# Patient Record
Sex: Female | Born: 1990 | Race: Black or African American | Hispanic: No | Marital: Single | Smoking: Former smoker
Health system: Southern US, Community
[De-identification: ages and names within clinical notes are randomized; demographics above are authoritative.]

---

## 2017-01-31 ENCOUNTER — Emergency Department (HOSPITAL_COMMUNITY)
Admission: EM | Admit: 2017-01-31 | Discharge: 2017-01-31 | Disposition: A | Discharge status: Home / Self Care | Payer: Self-pay | Attending: Emergency Medicine | Admitting: Emergency Medicine

## 2017-01-31 ENCOUNTER — Encounter (HOSPITAL_COMMUNITY): Payer: Self-pay

## 2017-01-31 DIAGNOSIS — Z87891 Personal history of nicotine dependence: Secondary | ICD-10-CM | POA: Insufficient documentation

## 2017-01-31 DIAGNOSIS — M5442 Lumbago with sciatica, left side: Secondary | ICD-10-CM | POA: Insufficient documentation

## 2017-01-31 DIAGNOSIS — R030 Elevated blood-pressure reading, without diagnosis of hypertension: Secondary | ICD-10-CM | POA: Insufficient documentation

## 2017-01-31 DIAGNOSIS — M5432 Sciatica, left side: Secondary | ICD-10-CM

## 2017-01-31 DIAGNOSIS — I10 Essential (primary) hypertension: Secondary | ICD-10-CM

## 2017-01-31 MED ORDER — KETOROLAC TROMETHAMINE 30 MG/ML IJ SOLN
30.0000 mg | Freq: Once | INTRAMUSCULAR | Status: AC
  Administered 2017-01-31: 30 mg via INTRAMUSCULAR
  Filled 2017-01-31: qty 1

## 2017-01-31 MED ORDER — CYCLOBENZAPRINE HCL 10 MG PO TABS: 10.0000 mg | ORAL_TABLET | Freq: Two times a day (BID) | ORAL | 0 refills | Status: AC | PRN

## 2017-01-31 MED ORDER — NAPROXEN 375 MG PO TABS: 375.0000 mg | ORAL_TABLET | Freq: Two times a day (BID) | ORAL | 0 refills | Status: AC

## 2017-01-31 MED ORDER — PREDNISONE 10 MG (21) PO TBPK: ORAL_TABLET | ORAL | 0 refills | Status: AC

## 2017-01-31 NOTE — Discharge Instructions (Signed)
Your blood pressure is elevated today.  This is sometimes related to pain but it would be a good idea to follow up with a primary care doctor for a physical and to have that rechecked.  Take the medications as prescribed.

## 2017-01-31 NOTE — ED Provider Notes (Addendum)
[facility]-EMERGENCY DEPT Provider Note   CSN: [id number] Arrival date & time: 01/31/17  0751     History   Chief Complaint Chief Complaint  Patient presents with  . Back Pain    HPI Melissa Wade is a 26 y.o. female.  HPI Patient presents to the emergency room for evaluation ofpain radiating from her back down her leg. Patient has had episodes like this in the past. Previously when she was living in [place] she was evaluated in the hospital because of an exacerbation. She was told that her symptoms were probably related to an episode of sciatica. Patient states she was given  Course of steroids and her symptoms resolved after a few days.  Intermittently she will have some flares but generally she does not have any trouble with back pain. She started having symptoms a few days ago again. She tried stretching but it has not helped. She has tried over-the-counter medications without relief. She denies any incontinence. No focal numbness or weakness. Pain is sharp and radiates from her left lower back region down to her leg. No recent falls or injuries History reviewed. No pertinent past medical history. Pt denies she could be pregnant There are no active problems to display for this patient.   History reviewed. No pertinent surgical history.  OB History    No data available       Home Medications    Prior to Admission medications   Medication Sig Start Date End Date Taking? Authorizing Provider  cyclobenzaprine (FLEXERIL) 10 MG tablet Take 1 tablet (10 mg total) by mouth 2 (two) times daily as needed for muscle spasms. 01/31/17   Linwood Dibbles, MD  naproxen (NAPROSYN) 375 MG tablet Take 1 tablet (375 mg total) by mouth 2 (two) times daily. 01/31/17   Linwood Dibbles, MD  predniSONE (STERAPRED UNI-PAK 21 TAB) 10 MG (21) TBPK tablet Take 6 tabs by mouth daily  for 2 days, then 5 tabs for 2 days, then 4 tabs for 2 days, then 3 tabs for 2 days, 2 tabs for 2 days, then 1 tab by mouth daily for 2 days  01/31/17   Linwood Dibbles, MD    Family History History reviewed. No pertinent family history.  Social History Social History  Substance Use Topics  . Smoking status: Former Smoker    Types: Cigarettes  . Smokeless tobacco: Never Used  . Alcohol use Yes     Comment: occasionally     Allergies   Patient has no known allergies.   Review of Systems Review of Systems  All other systems reviewed and are negative.    Physical Exam Updated Vital Signs BP (!) 161/105 (BP Location: Left Arm)   Pulse 87   Temp 98.6 F (37 C) (Oral)   Resp 18   Ht 1.778 m ( )   Wt 122.5 kg (270 lb)   LMP 12/31/2016 (Approximate)   SpO2 98%   BMI 38.74 kg/m   Physical Exam  Constitutional: She appears well-developed and well-nourished. No distress.  overweight  HENT:  Head: Normocephalic and atraumatic.  Right Ear: External ear normal.  Left Ear: External ear normal.  Eyes: Conjunctivae are normal. Right eye exhibits no discharge. Left eye exhibits no discharge. No scleral icterus.  Neck: Neck supple. No tracheal deviation present.  Cardiovascular: Normal rate.   Pulmonary/Chest: Effort normal. No stridor. No respiratory distress.  Abdominal: She exhibits no distension.  Musculoskeletal: She exhibits no edema.       Lumbar back: She  exhibits tenderness. She exhibits no bony tenderness, no swelling and no deformity.  Neurological: She is alert. No sensory deficit. Cranial nerve deficit: no gross deficits. She exhibits normal muscle tone. Coordination normal.  5/5 strength bilateral lower extrem   Skin: Skin is warm and dry. No rash noted.  Psychiatric: She has a normal mood and affect.  Nursing note and vitals reviewed.    ED Treatments / Results    Radiology No results found.  Procedures Procedures (including critical care time)  Medications Ordered in ED Medications  ketorolac (TORADOL) 30 MG/ML injection 30 mg (not administered)     Initial Impression / Assessment  and Plan / ED Course  I have reviewed the triage vital signs and the nursing notes.  Pertinent labs & imaging results that were available during my care of the patient were reviewed by me and considered in my medical decision making (see chart for details).    No sign of acute neurological or vascular emergency associated with pt's back pain.  May have a component of sciatica.  Safe for outpatient follow up.   HTN noted.  Could be related to her pain.  Recommended routine recheck  Final Clinical Impressions(s) / ED Diagnoses   Final diagnoses:  Sciatica of left side  Hypertension, unspecified type    New Prescriptions New Prescriptions   CYCLOBENZAPRINE (FLEXERIL) 10 MG TABLET    Take 1 tablet (10 mg total) by mouth 2 (two) times daily as needed for muscle spasms.   NAPROXEN (NAPROSYN) 375 MG TABLET    Take 1 tablet (375 mg total) by mouth 2 (two) times daily.   PREDNISONE (STERAPRED UNI-PAK 21 TAB) 10 MG (21) TBPK TABLET    Take 6 tabs by mouth daily  for 2 days, then 5 tabs for 2 days, then 4 tabs for 2 days, then 3 tabs for 2 days, 2 tabs for 2 days, then 1 tab by mouth daily for 2 days     Linwood Dibbles, MD 01/31/17 1004    Linwood Dibbles, MD 01/31/17 1032

## 2017-01-31 NOTE — ED Triage Notes (Signed)
Patient c/o low back pain that radiates down the left leg. Patient states she was told that it was her sciatica and was told to stretch. Patient states stretching makes the pain worse. Patient states she usually can alleviate the pain with Ibuprofen, liquid Lidocaine and stretchnig, but today she is unable to do.

## 2017-05-25 ENCOUNTER — Emergency Department (HOSPITAL_COMMUNITY)
Admission: EM | Admit: 2017-05-25 | Discharge: 2017-05-25 | Disposition: A | Discharge status: Home / Self Care | Payer: Worker's Compensation | Attending: Emergency Medicine | Admitting: Emergency Medicine

## 2017-05-25 ENCOUNTER — Encounter (HOSPITAL_COMMUNITY): Payer: Self-pay | Admitting: *Deleted

## 2017-05-25 ENCOUNTER — Emergency Department (HOSPITAL_COMMUNITY): Payer: Worker's Compensation

## 2017-05-25 ENCOUNTER — Other Ambulatory Visit: Payer: Self-pay

## 2017-05-25 DIAGNOSIS — Y939 Activity, unspecified: Secondary | ICD-10-CM | POA: Diagnosis not present

## 2017-05-25 DIAGNOSIS — Y999 Unspecified external cause status: Secondary | ICD-10-CM | POA: Diagnosis not present

## 2017-05-25 DIAGNOSIS — S93602A Unspecified sprain of left foot, initial encounter: Secondary | ICD-10-CM | POA: Insufficient documentation

## 2017-05-25 DIAGNOSIS — Z79899 Other long term (current) drug therapy: Secondary | ICD-10-CM | POA: Insufficient documentation

## 2017-05-25 DIAGNOSIS — Y929 Unspecified place or not applicable: Secondary | ICD-10-CM | POA: Diagnosis not present

## 2017-05-25 DIAGNOSIS — Z87891 Personal history of nicotine dependence: Secondary | ICD-10-CM | POA: Insufficient documentation

## 2017-05-25 DIAGNOSIS — W109XXA Fall (on) (from) unspecified stairs and steps, initial encounter: Secondary | ICD-10-CM | POA: Insufficient documentation

## 2017-05-25 DIAGNOSIS — S93609A Unspecified sprain of unspecified foot, initial encounter: Secondary | ICD-10-CM

## 2017-05-25 NOTE — ED Notes (Signed)
[facility] Expected date:  Expected time:  Means of arrival:  Comments: Held for 24. Patient having xray in room.

## 2017-05-25 NOTE — ED Triage Notes (Signed)
Stepped off triangle step yesterday hurting left great toe. Pain more intense today

## 2017-05-25 NOTE — ED Provider Notes (Signed)
[facility]-EMERGENCY DEPT Provider Note   CSN: [id number] Arrival date & time: 05/25/17  1005     History   Chief Complaint Chief Complaint  Patient presents with  . Foot Pain    HPI Melissa Wade is a [age] y.o. female.  HPI   27 year old female with pain in the first MT joint of the left foot.  Onset yesterday.  Patient misstepped while going up a flight of steps and stepped awkwardly.  She was wearing closed toe foot wear.  She had some pain but it was tolerable and she was able to bear weight.  This morning the pain had increased to the point where it significantly altered her gait.  She can bear some weight on it though.  History reviewed. No pertinent past medical history.  There are no active problems to display for this patient.   History reviewed. No pertinent surgical history.  OB History    No data available       Home Medications    Prior to Admission medications   Medication Sig Start Date End Date Taking? Authorizing Provider  cyclobenzaprine (FLEXERIL) 10 MG tablet Take 1 tablet (10 mg total) by mouth 2 (two) times daily as needed for muscle spasms. 01/31/17   Linwood DibblesKnapp, Jon, MD  naproxen (NAPROSYN) 375 MG tablet Take 1 tablet (375 mg total) by mouth 2 (two) times daily. 01/31/17   Linwood DibblesKnapp, Jon, MD  predniSONE (STERAPRED UNI-PAK 21 TAB) 10 MG (21) TBPK tablet Take 6 tabs by mouth daily  for 2 days, then 5 tabs for 2 days, then 4 tabs for 2 days, then 3 tabs for 2 days, 2 tabs for 2 days, then 1 tab by mouth daily for 2 days 01/31/17   Linwood DibblesKnapp, Jon, MD    Family History No family history on file.  Social History Social History   Tobacco Use  . Smoking status: Former Smoker    Types: Cigarettes  . Smokeless tobacco: Never Used  Substance Use Topics  . Alcohol use: Yes    Comment: occasionally  . Drug use: No     Allergies   Patient has no known allergies.   Review of Systems Review of Systems  All systems reviewed and negative,  other than as noted in HPI.   Physical Exam Updated Vital Signs BP (!) 141/96 (BP Location: Left Arm)   Pulse 81   Temp 98.5 F (36.9 C) (Oral)   Resp 16   Ht 5\' 10"  (1.778 m)   Wt 124.7 kg (275 lb)   SpO2 99%   BMI 39.46 kg/m    Physical Exam  Constitutional: She appears well-developed and well-nourished. No distress.  HENT:  Head: Normocephalic and atraumatic.  Eyes: Conjunctivae are normal. Right eye exhibits no discharge. Left eye exhibits no discharge.  Neck: Neck supple.  Cardiovascular: Normal rate, regular rhythm and normal heart sounds. Exam reveals no gallop and no friction rub.  No murmur heard. Pulmonary/Chest: Effort normal and breath sounds normal. No respiratory distress.  Abdominal: Soft. She exhibits no distension. There is no tenderness.  Musculoskeletal: She exhibits no edema or tenderness.  Mild erythema in the first MP joint left foot.  No swelling.  Skin is intact.  She has increased range of motion of the MT joint but she can actively range it.  Sensation is intact distally and she has good cap refill.  Neurological: She is alert.  Skin: Skin is warm and dry.  Psychiatric: She has a normal mood  and affect. Her behavior is normal. Thought content normal.  Nursing note and vitals reviewed.    ED Treatments / Results  Labs (all labs ordered are listed, but only abnormal results are displayed) Labs Reviewed - No data to display  EKG  EKG Interpretation None       Radiology No results found.  Procedures Procedures (including critical care time)  Medications Ordered in ED Medications - No data to display   Initial Impression / Assessment and Plan / ED Course  I have reviewed the triage vital signs and the nursing notes.  Pertinent labs & imaging results that were available during my care of the patient were reviewed by me and considered in my medical decision making (see chart for details).     Likely soft tissue injury. Delayed onset  of more severe pain. NVI. Negative imaging. RICE. Supportive foot wear.   Final Clinical Impressions(s) / ED Diagnoses   Final diagnoses:  Sprain of foot, unspecified laterality, initial encounter    ED Discharge Orders    None      Raeford Razor, MD 05/27/17 [phone]

## 2017-07-31 ENCOUNTER — Encounter (HOSPITAL_COMMUNITY): Payer: Self-pay | Admitting: Emergency Medicine

## 2017-07-31 ENCOUNTER — Emergency Department (HOSPITAL_COMMUNITY)
Admission: EM | Admit: 2017-07-31 | Discharge: 2017-07-31 | Disposition: A | Discharge status: Home / Self Care | Payer: Self-pay | Attending: Emergency Medicine | Admitting: Emergency Medicine

## 2017-07-31 ENCOUNTER — Emergency Department (HOSPITAL_COMMUNITY): Payer: Self-pay

## 2017-07-31 DIAGNOSIS — J069 Acute upper respiratory infection, unspecified: Secondary | ICD-10-CM | POA: Insufficient documentation

## 2017-07-31 DIAGNOSIS — Z87891 Personal history of nicotine dependence: Secondary | ICD-10-CM | POA: Insufficient documentation

## 2017-07-31 DIAGNOSIS — J040 Acute laryngitis: Secondary | ICD-10-CM | POA: Insufficient documentation

## 2017-07-31 DIAGNOSIS — B9789 Other viral agents as the cause of diseases classified elsewhere: Secondary | ICD-10-CM | POA: Insufficient documentation

## 2017-07-31 DIAGNOSIS — R0981 Nasal congestion: Secondary | ICD-10-CM | POA: Insufficient documentation

## 2017-07-31 MED ORDER — BENZONATATE 100 MG PO CAPS: 100.0000 mg | ORAL_CAPSULE | Freq: Three times a day (TID) | ORAL | 0 refills | Status: AC

## 2017-07-31 NOTE — ED Provider Notes (Signed)
[facility]-EMERGENCY DEPT Provider Note   CSN: [id number] Arrival date & time: 07/31/17  1209     History   Chief Complaint Chief Complaint  Patient presents with  . Laryngitis  . Cough    HPI Melissa Wade is a 27 y.o. female with no significant past medical history presents emergency department today for cough.  Patient states that 2 days ago she started having a productive cough with clear sputum, nasal congestion and mild rhinorrhea.  She reports that yesterday she awoke and lost her voice.  She has been taking Tylenol Cold and flu as well as NyQuil for her symptoms without relief.  She does report sick contacts.  She reports she does vape.  Patient denies any fever, chills, headache, ear fullness, sore throat, sinus pressure, trauma, chest pain, shortness of breath, hemoptysis, lower leg swelling.   HPI  History reviewed. No pertinent past medical history.  There are no active problems to display for this patient.   History reviewed. No pertinent surgical history.  OB History   None      Home Medications    Prior to Admission medications   Medication Sig Start Date End Date Taking? Authorizing Provider  cyclobenzaprine (FLEXERIL) 10 MG tablet Take 1 tablet (10 mg total) by mouth 2 (two) times daily as needed for muscle spasms. 01/31/17   Linwood Dibbles, MD  naproxen (NAPROSYN) 375 MG tablet Take 1 tablet (375 mg total) by mouth 2 (two) times daily. 01/31/17   Linwood Dibbles, MD  predniSONE (STERAPRED UNI-PAK 21 TAB) 10 MG (21) TBPK tablet Take 6 tabs by mouth daily  for 2 days, then 5 tabs for 2 days, then 4 tabs for 2 days, then 3 tabs for 2 days, 2 tabs for 2 days, then 1 tab by mouth daily for 2 days 01/31/17   Linwood Dibbles, MD    Family History No family history on file.  Social History Social History   Tobacco Use  . Smoking status: Former Smoker    Types: Cigarettes  . Smokeless tobacco: Never Used  Substance Use Topics  . Alcohol use: Yes   Comment: occasionally  . Drug use: No     Allergies   Patient has no known allergies.   Review of Systems Review of Systems  All other systems reviewed and are negative.    Physical Exam Updated Vital Signs BP (!) 149/113   Pulse 85   Temp 98.5 F (36.9 C) (Oral)   Resp 18   LMP 07/13/2017   SpO2 98%   Physical Exam  Constitutional: She appears well-developed and well-nourished.  HENT:  Head: Normocephalic and atraumatic.  Right Ear: External ear normal. No mastoid tenderness. Tympanic membrane is scarred. Tympanic membrane is not erythematous, not retracted and not bulging.  Left Ear: Tympanic membrane and external ear normal. No mastoid tenderness. Tympanic membrane is not erythematous, not retracted and not bulging.  Nose: Mucosal edema present. Right sinus exhibits no maxillary sinus tenderness and no frontal sinus tenderness. Left sinus exhibits no maxillary sinus tenderness and no frontal sinus tenderness.  Mouth/Throat: Uvula is midline, oropharynx is clear and moist and mucous membranes are normal. No tonsillar exudate.  The patient has obvious laryngitis. She is in control of secretions. No stridor.  Midline uvula without edema. Soft palate rises symmetrically.  No tonsillar erythema or exudates. No PTA. Tongue protrusion is normal. No trismus. No creptius on neck palpation and patient has good dentition. No gingival erythema or fluctuance noted.  Mucus membranes moist.   Eyes: Pupils are equal, round, and reactive to light. Right eye exhibits no discharge. Left eye exhibits no discharge. No scleral icterus.  Neck: Trachea normal. Neck supple. No spinous process tenderness present. No neck rigidity. Normal range of motion present.  Cardiovascular: Normal rate, regular rhythm and intact distal pulses.  No murmur heard. Pulses:      Radial pulses are 2+ on the right side, and 2+ on the left side.       Dorsalis pedis pulses are 2+ on the right side, and 2+ on the left  side.       Posterior tibial pulses are 2+ on the right side, and 2+ on the left side.  No lower extremity swelling or edema. Calves symmetric in size bilaterally.  Pulmonary/Chest: Effort normal and breath sounds normal. She has no decreased breath sounds. She has no wheezes. She has no rhonchi. She has no rales. She exhibits no tenderness.  No increased work of breathing. No accessory muscle use. Patient is sitting upright, speaking in full sentences without difficulty   Abdominal: Soft. Bowel sounds are normal. There is no tenderness. There is no rebound and no guarding.  Musculoskeletal: She exhibits no edema.  Lymphadenopathy:    She has no cervical adenopathy.  Neurological: She is alert.  Skin: Skin is warm, dry and intact. No petechiae, no purpura and no rash noted. She is not diaphoretic.  Psychiatric: She has a normal mood and affect.  Nursing note and vitals reviewed.    ED Treatments / Results  Labs (all labs ordered are listed, but only abnormal results are displayed) Labs Reviewed - No data to display  EKG  EKG Interpretation None       Radiology Dg Chest 2 View  Result Date: 07/31/2017 CLINICAL DATA:  Cough, sore throat, and hoarseness over the past 3 days. Current smoker. EXAM: CHEST - 2 VIEW COMPARISON:  None in PACs FINDINGS: The lungs are adequately inflated and clear. The heart and pulmonary vascularity are normal. The mediastinum is normal in width. The trachea is midline. There is no pleural effusion. IMPRESSION: There is no pneumonia nor other acute cardiopulmonary abnormality. Electronically Signed   By: David  [place] M.D.   On: 07/31/2017 14:22    Procedures Procedures (including critical care time)  Medications Ordered in ED Medications - No data to display   Initial Impression / Assessment and Plan / ED Course  I have reviewed the triage vital signs and the nursing notes.  Pertinent labs & imaging results that were available during my care of the  patient were reviewed by me and considered in my medical decision making (see chart for details).     27 y.o. female with 2 day history of cough, congestion, mild rhinorrhea and yesterday awoke with laryngitis. Pt CXR negative for acute infiltrate. Patients symptoms are consistent with URI, likely viral etiology. Oropharynx clear. No concern for RPA or PTA. Discussed that antibiotics are not indicated for viral infections.  Laryngitis secondary to cough most likely.  Pt will be discharged with symptomatic treatment.  Verbalizes understanding and is agreeable with plan. Pt is hemodynamically stable & in NAD prior to dc.  Final Clinical Impressions(s) / ED Diagnoses   Final diagnoses:  Viral URI with cough  Laryngitis    ED Discharge Orders        Ordered    benzonatate (TESSALON) 100 MG capsule  Every 8 hours     07/31/17 1439  Jacinto HalimMaczis, Chandlor Noecker M, PA-C 07/31/17 1440    Shaune PollackIsaacs, Cameron, MD 07/31/17 2046

## 2017-07-31 NOTE — Discharge Instructions (Addendum)
Please read and follow all provided instructions.  Your diagnoses today include:  1. Viral URI with cough   2. Laryngitis     Tests performed today include: Vital signs. See below for your results today.  Chest xray - this did not show signs of pneumonia.   Medications prescribed/advised:  1. Musinex [Guaifenesin] as a decongestant [thin mucus - you have to be well hydrated when taking this for it to work] - you can find this over the counter.  2. Tylenol for fever/pain and Motrin/Ibuprofen for muscle aches 3. Flonase Steroid Nasal Spray. This does not work to maximum capability unless used daily >1-2 weeks.  4.  Cough Suppressant: Take tessalon as prescribed.   Home care instructions:  An upper respiratory infection (URI) is also sometimes known as the common cold. Most people improve within 1 week, but symptoms can last up to 2 weeks. A residual cough may last even longer.   URI is most commonly caused by a virus. Viruses are NOT treated with antibiotics. You can easily spread the virus to others by oral contact. This includes kissing, sharing a glass, coughing, or sneezing. Touching your mouth or nose and then touching a surface, which is then touched by another person, can also spread the virus.   TREATMENT  Treatment is directed at relieving symptoms. There is no cure. Antibiotics are not effective, because the infection is caused by a virus, not by bacteria. Treatment may include:  Increased fluid intake. Sports drinks offer valuable electrolytes, sugars, and fluids.  Breathing heated mist or steam (vaporizer or shower).  Eating chicken soup or other clear broths, and maintaining good nutrition.  Getting plenty of rest.  Using gargles or lozenges for comfort.  Controlling fevers with ibuprofen or acetaminophen as directed by your caregiver.  Increasing usage of your inhaler if you have asthma.  Return to work when your temperature has returned to normal.   Follow-up  instructions: Followup with your primary care doctor in 4 days if your symptoms persist.  Your more than welcome to return to the emergency department if symptoms worsen or become concerning.  Return instructions:  Please return to the Emergency Department if you do not get better, if you get worse, or new symptoms OR  - Fever (temperature greater than 101.8F)  - Bleeding that does not stop with holding pressure to the area    -Severe pain (please note that you may be more sore the day after your accident)  - Chest Pain  - Difficulty breathing (worsening shortness of breath with sputum production may  be a sign of pneumonia.   - Severe nausea or vomiting  - Inability to tolerate food and liquids  - Passing out  - Skin becoming red around your wounds  - Change in mental status (confusion or lethargy)  - New numbness or weakness     -You develop fever, swollen neck glands, pain with swallowing or white areas on  the back of your throat. This may be a sign of strep throat.  Please return if you have any other emergent concerns.  Additional Information:  Your vital signs today were: BP (!) 149/113    Pulse 85    Temp 98.5 F (36.9 C) (Oral)    Resp 18    LMP 07/13/2017    SpO2 98%  If your blood pressure (BP) was elevated above 135/85 this visit, please have this repeated by your doctor within one month.

## 2017-07-31 NOTE — ED Triage Notes (Signed)
Pt reports had productive cough for couple days and and then woke up today with hoarse voice. Reports throat is little sore.

## 2018-06-19 ENCOUNTER — Encounter (HOSPITAL_COMMUNITY): Payer: Self-pay

## 2018-06-19 ENCOUNTER — Emergency Department (HOSPITAL_COMMUNITY)
Admission: EM | Admit: 2018-06-19 | Discharge: 2018-06-19 | Disposition: A | Discharge status: Home / Self Care | Payer: Self-pay | Attending: Emergency Medicine | Admitting: Emergency Medicine

## 2018-06-19 ENCOUNTER — Other Ambulatory Visit: Payer: Self-pay

## 2018-06-19 DIAGNOSIS — R110 Nausea: Secondary | ICD-10-CM | POA: Insufficient documentation

## 2018-06-19 DIAGNOSIS — R42 Dizziness and giddiness: Secondary | ICD-10-CM | POA: Insufficient documentation

## 2018-06-19 DIAGNOSIS — R0602 Shortness of breath: Secondary | ICD-10-CM | POA: Insufficient documentation

## 2018-06-19 DIAGNOSIS — Z5321 Procedure and treatment not carried out due to patient leaving prior to being seen by health care provider: Secondary | ICD-10-CM | POA: Insufficient documentation

## 2018-06-19 NOTE — ED Triage Notes (Signed)
Pt reports that she was diagnosed with the flu on Wednesday. She could not afford the Tamiflu and has been taking elderberry instead. She states that her symptoms improved, but she woke up around 230 and was dizzy and nauseous again. A&Ox4. No vomiting.

## 2019-03-21 IMAGING — DX DG FOOT COMPLETE 3+V*L*
3 series · 3 of 3 positions shown · non-contrast
Comparison: None.

CLINICAL DATA: Injury

EXAM:
LEFT FOOT - COMPLETE 3+ VIEW

[foot ap]
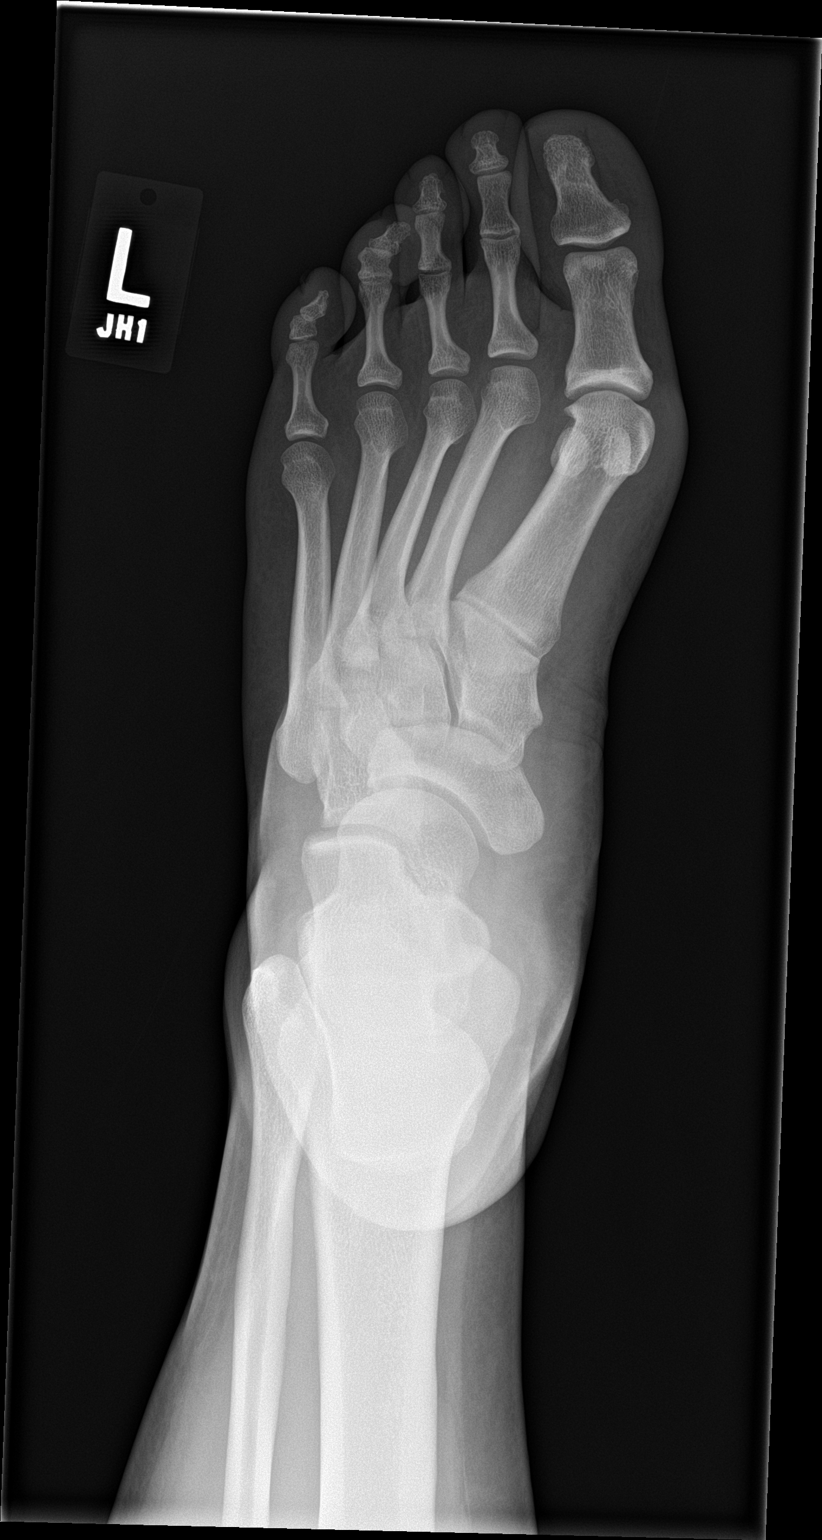

[foot obl]
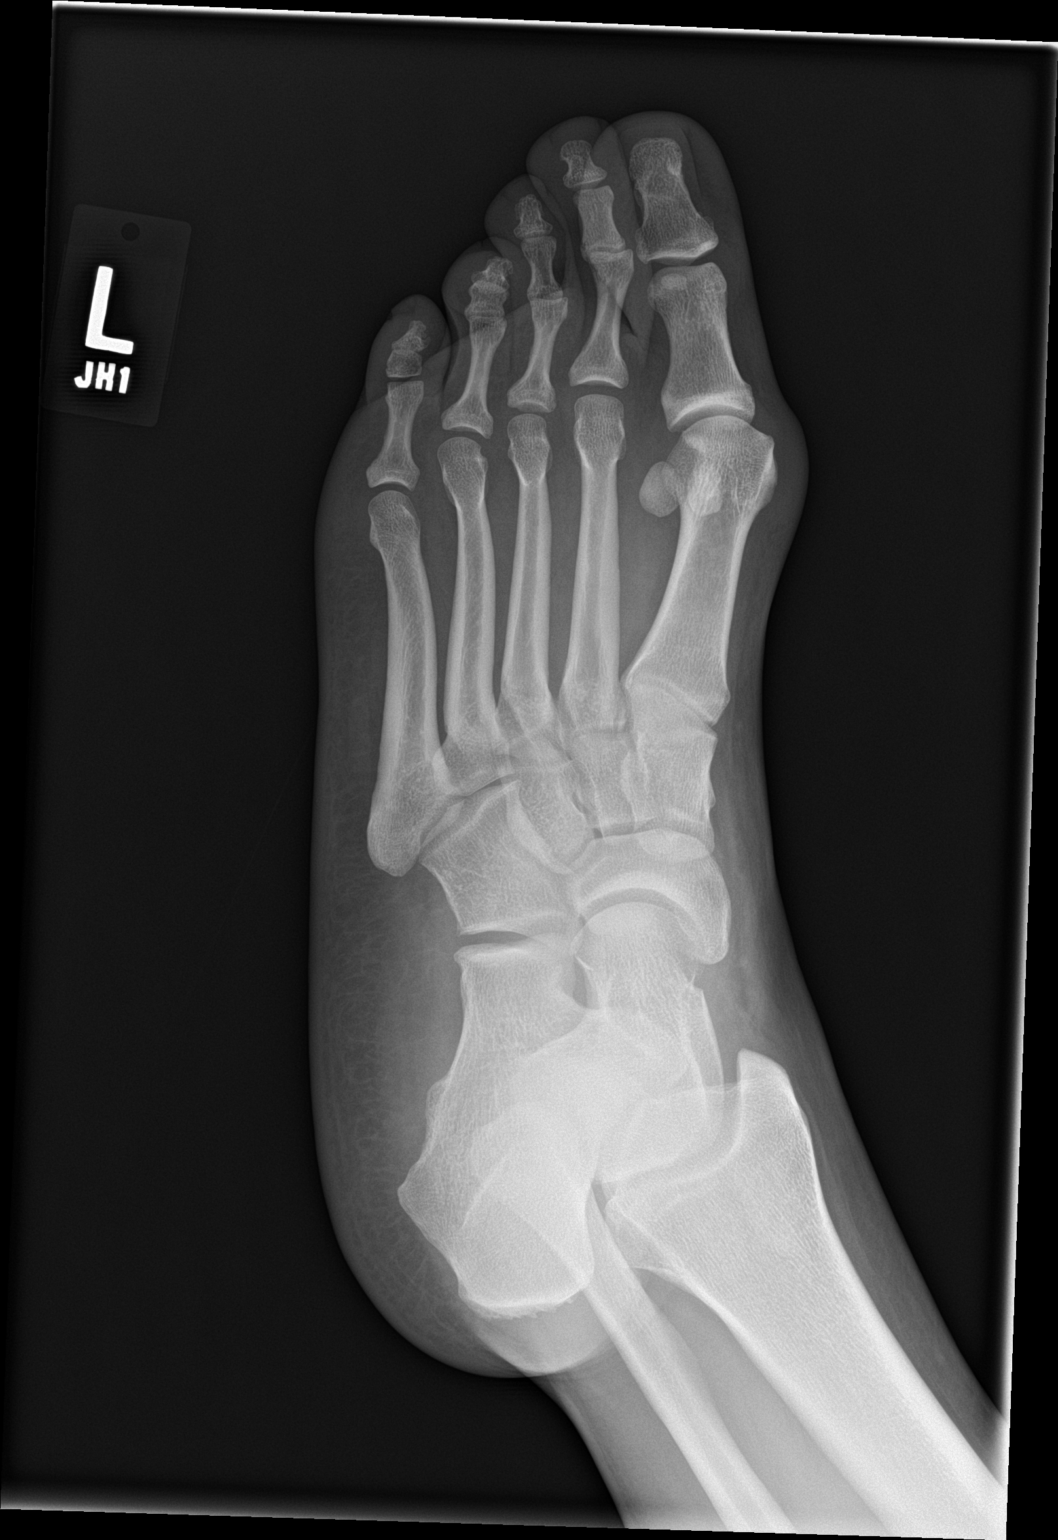

[foot lat]
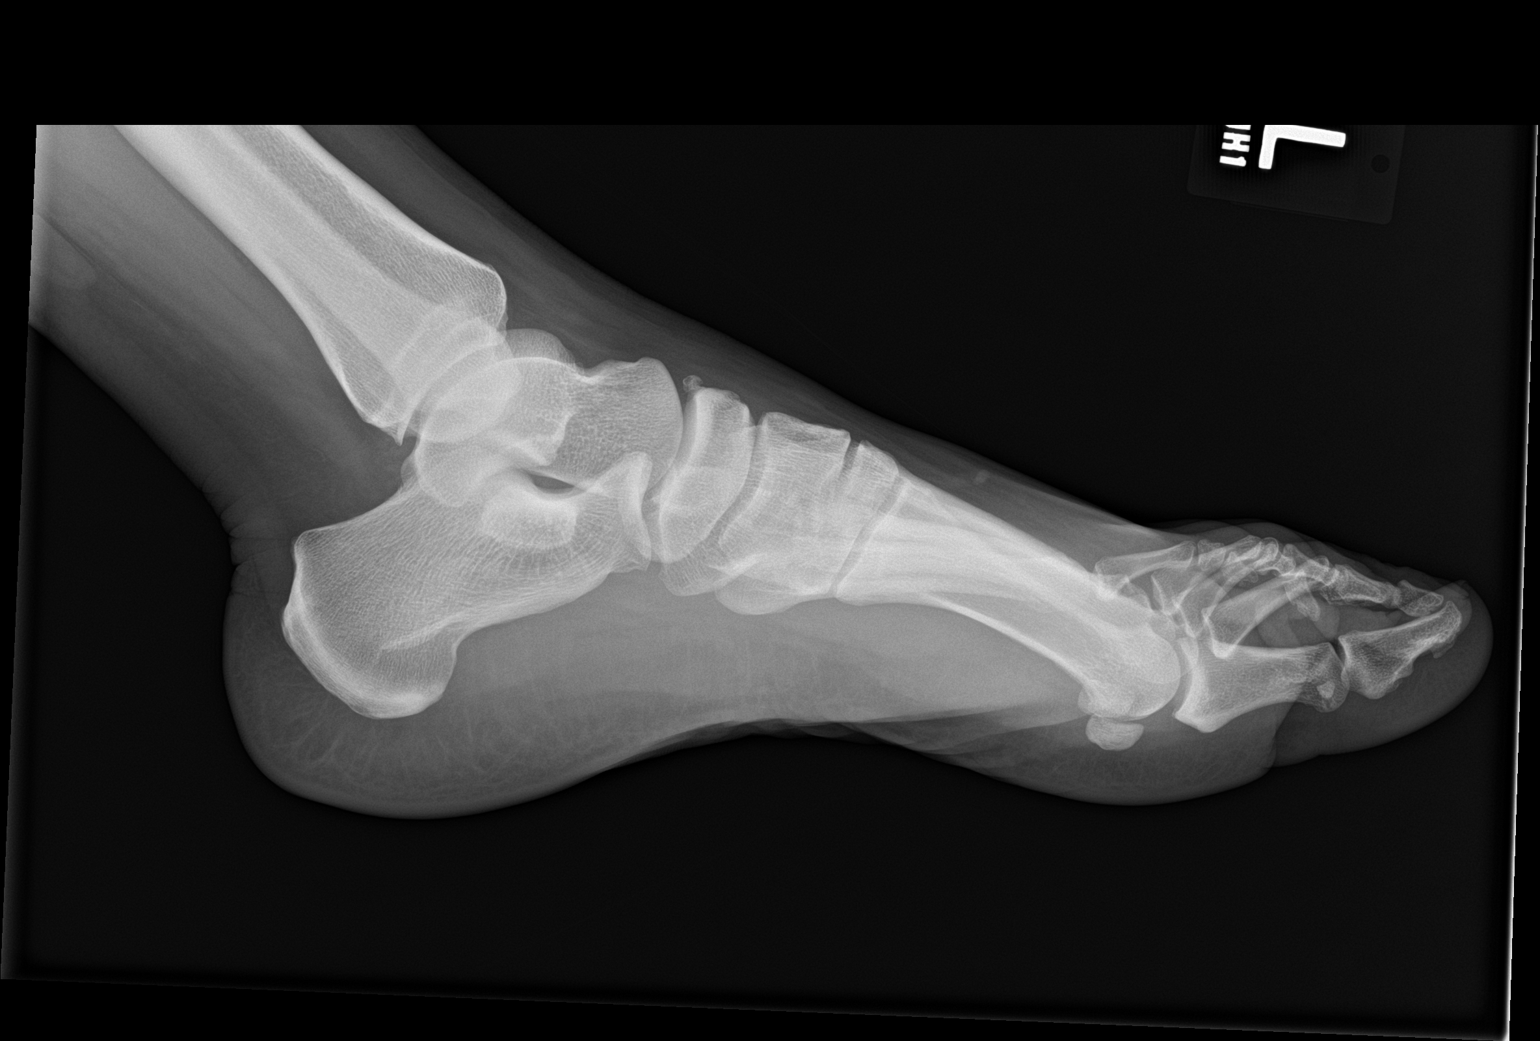

[3 of 3 positions shown; findings below may reference images not displayed]

FINDINGS: No acute fracture. No dislocation. Unremarkable soft tissues.
Chronic appearing bony density is present dorsal to the navicular.
Mild hallux valgus deformity.
IMPRESSION: No acute bony pathology.

## 2019-05-27 IMAGING — CR DG CHEST 2V
2 series · 2 of 2 positions shown · non-contrast
Comparison: None in PACs

CLINICAL DATA: Cough, sore throat, and hoarseness over the past 3
days. Current smoker.

EXAM:
CHEST - 2 VIEW

[w chest pa]
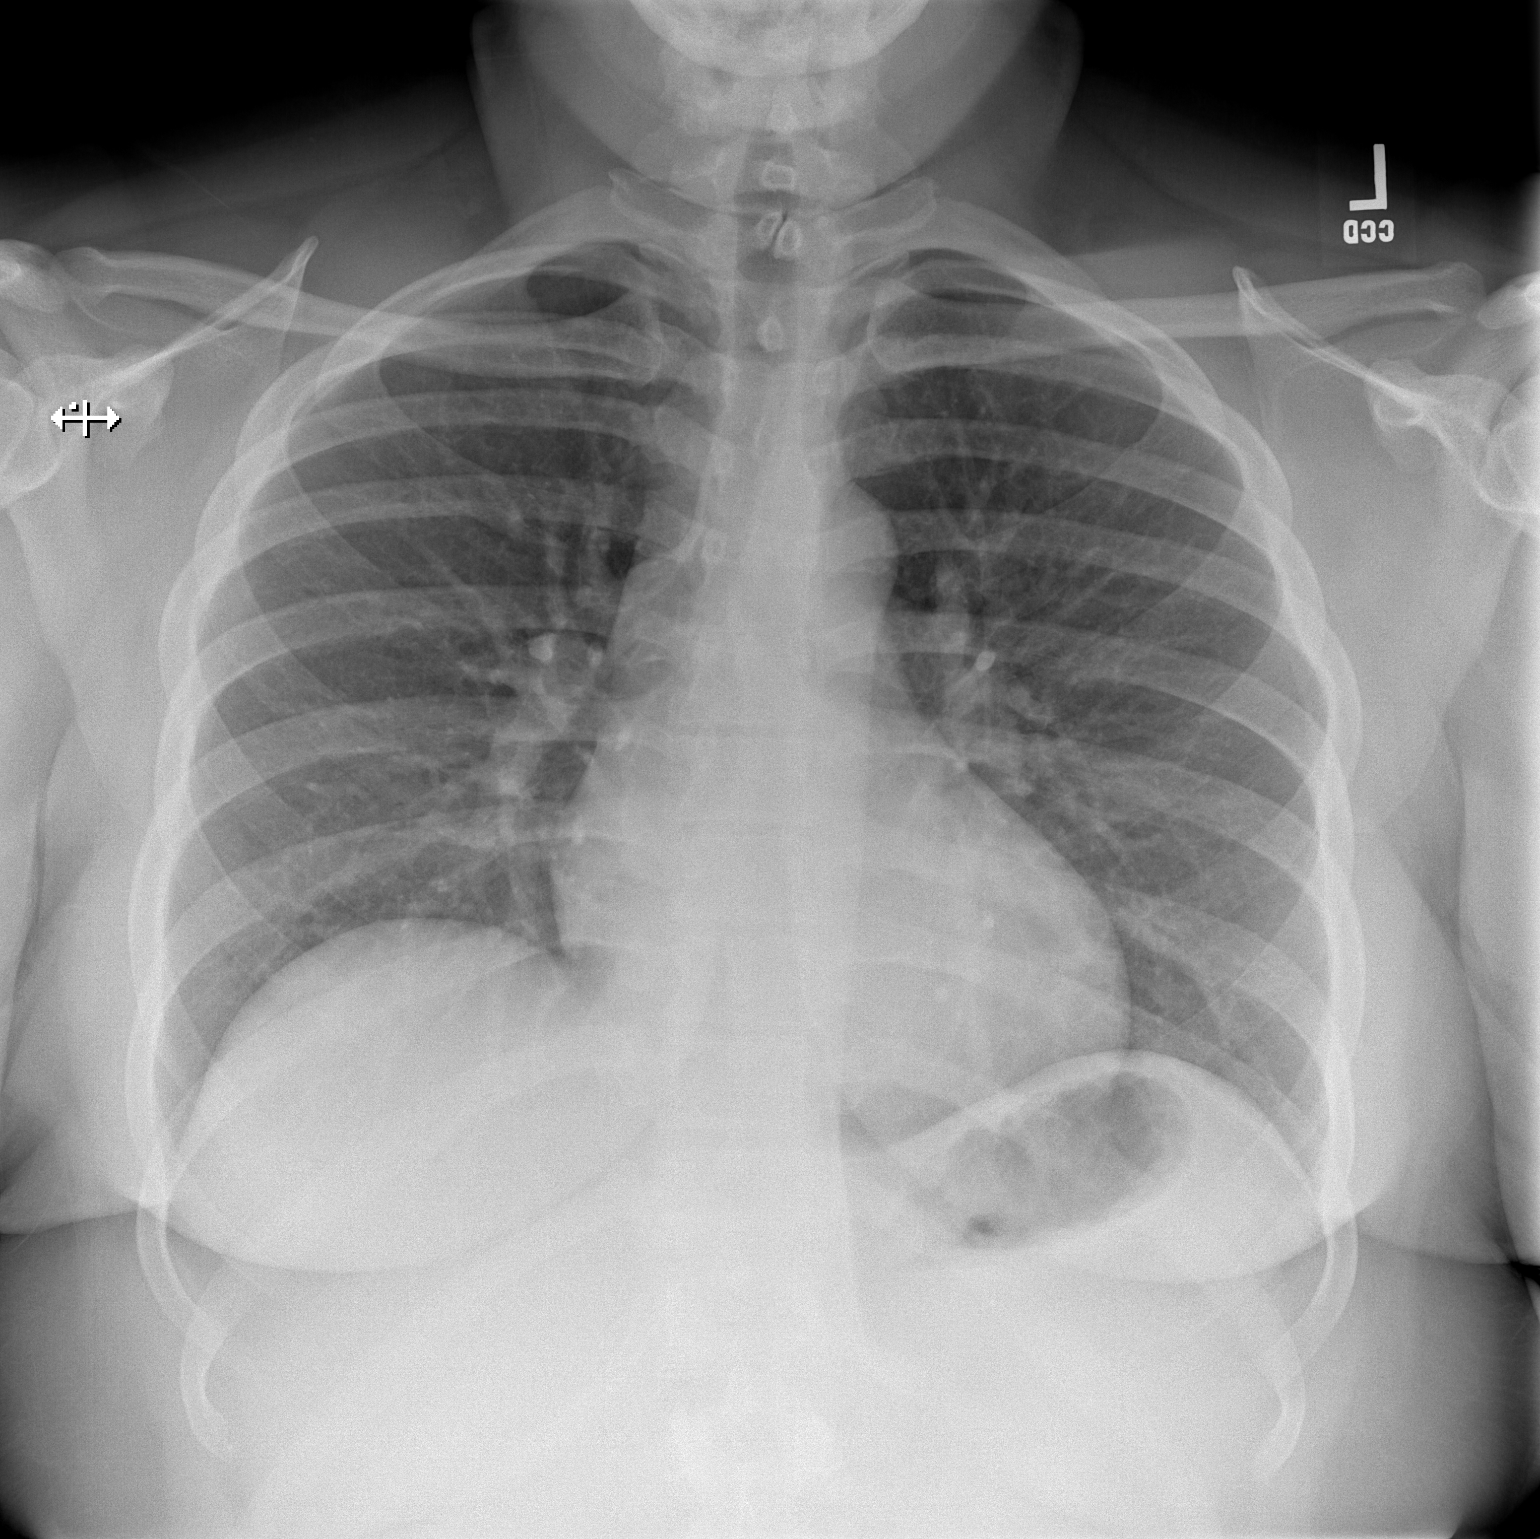

[w chest lat]
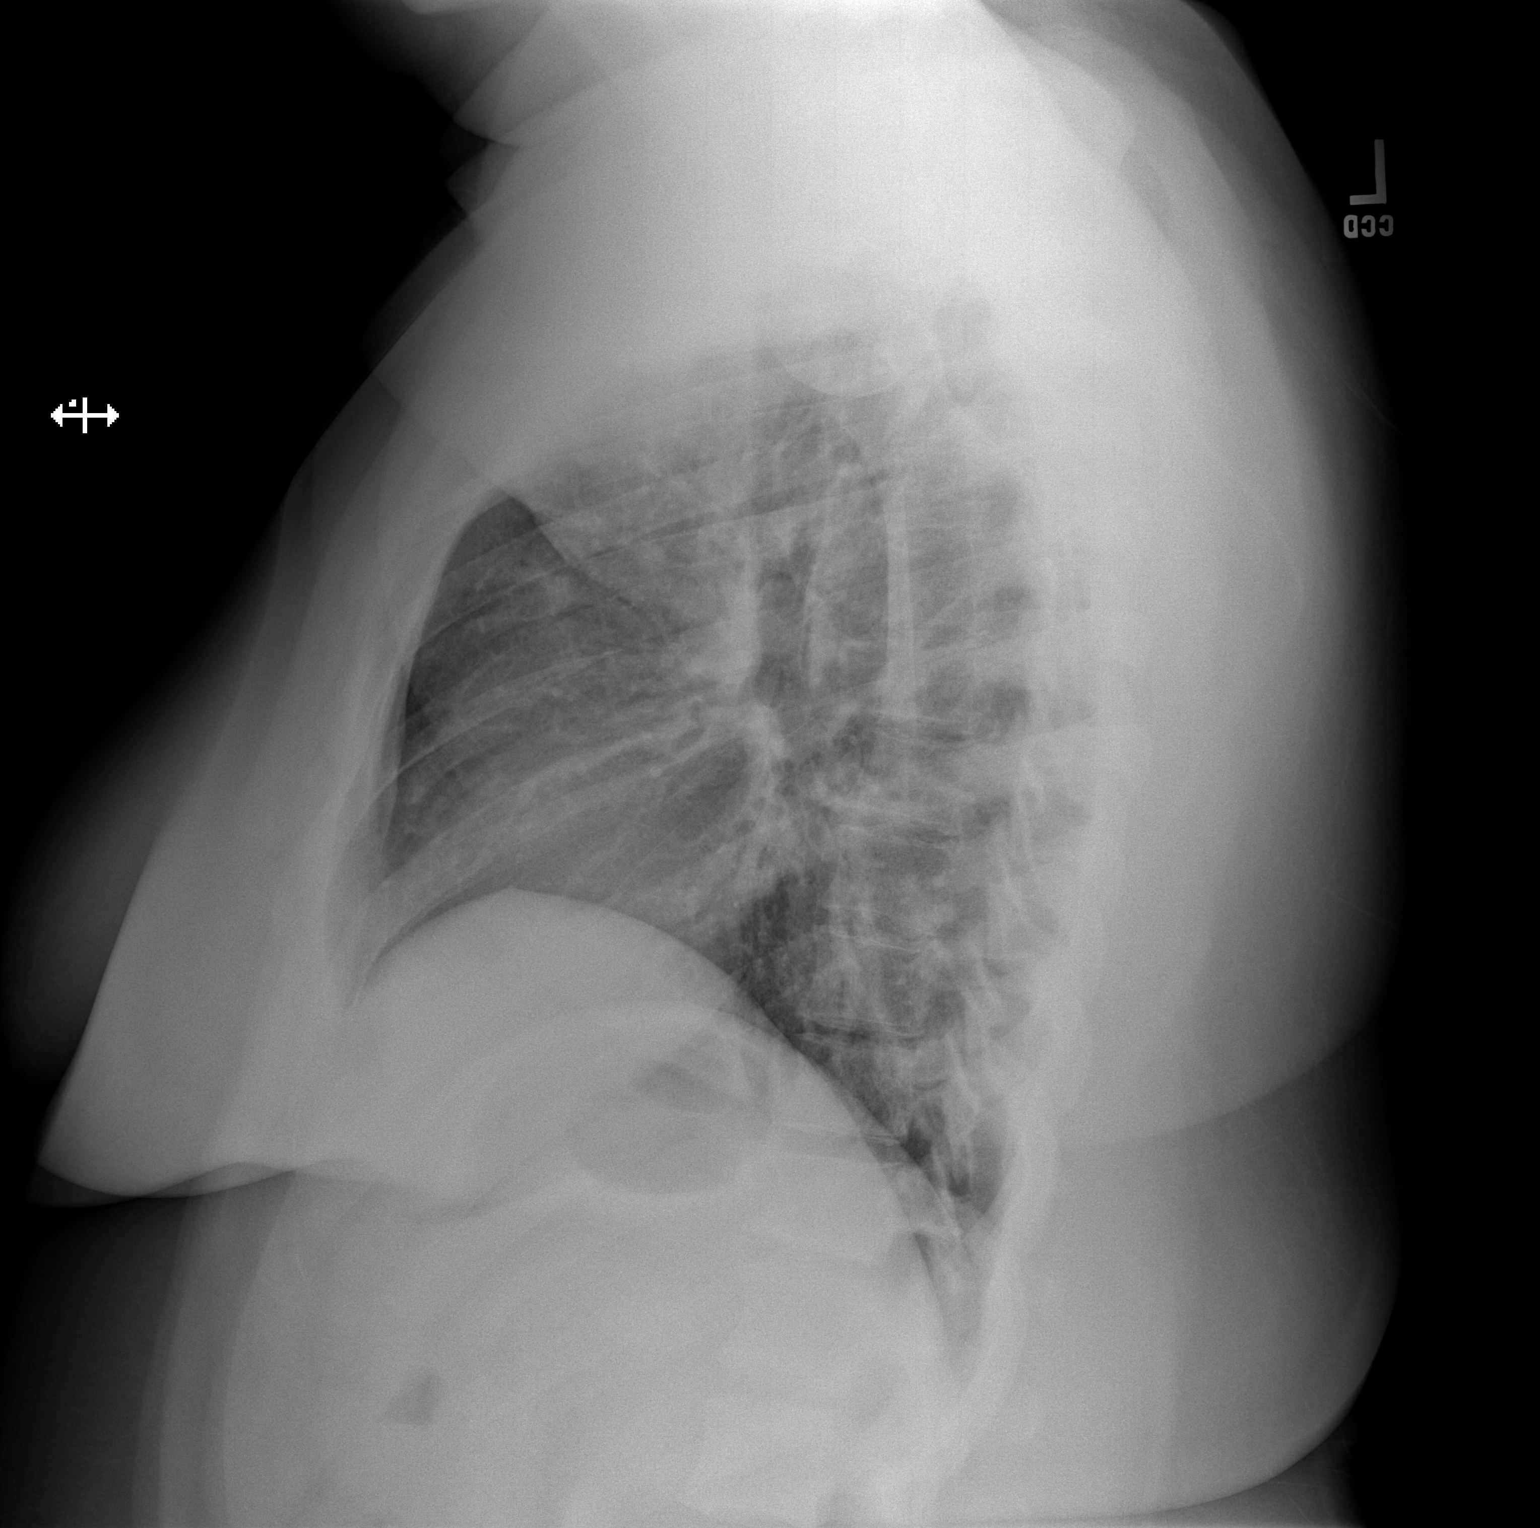

[2 of 2 positions shown; findings below may reference images not displayed]

FINDINGS: The lungs are adequately inflated and clear. The heart and pulmonary
vascularity are normal. The mediastinum is normal in width. The
trachea is midline. There is no pleural effusion.
IMPRESSION: There is no pneumonia nor other acute cardiopulmonary abnormality.
